# Patient Record
Sex: Female | Born: 1970 | Race: White | Hispanic: Yes | Marital: Married | Smoking: Never smoker
Health system: Southern US, Community
[De-identification: ages and names within clinical notes are randomized; demographics above are authoritative.]

---

## 1998-09-12 ENCOUNTER — Other Ambulatory Visit: Admission: RE | Admit: 1998-09-12 | Discharge: 1998-09-12 | Payer: Self-pay | Admitting: Gynecology

## 1999-11-27 ENCOUNTER — Other Ambulatory Visit: Admission: RE | Admit: 1999-11-27 | Discharge: 1999-11-27 | Payer: Self-pay | Admitting: Gynecology

## 2001-04-15 ENCOUNTER — Other Ambulatory Visit: Admission: RE | Admit: 2001-04-15 | Discharge: 2001-04-15 | Payer: Self-pay | Admitting: Gynecology

## 2002-06-06 ENCOUNTER — Other Ambulatory Visit: Admission: RE | Admit: 2002-06-06 | Discharge: 2002-06-06 | Payer: Self-pay | Admitting: Obstetrics and Gynecology

## 2003-02-14 ENCOUNTER — Other Ambulatory Visit: Admission: RE | Admit: 2003-02-14 | Discharge: 2003-02-14 | Payer: Self-pay | Admitting: Gynecology

## 2003-09-04 ENCOUNTER — Inpatient Hospital Stay (HOSPITAL_COMMUNITY): Admission: AD | Admit: 2003-09-04 | Discharge: 2003-09-07 | Payer: Self-pay | Admitting: Gynecology

## 2003-09-04 ENCOUNTER — Encounter (INDEPENDENT_AMBULATORY_CARE_PROVIDER_SITE_OTHER): Payer: Self-pay | Admitting: Specialist

## 2003-10-16 ENCOUNTER — Other Ambulatory Visit: Admission: RE | Admit: 2003-10-16 | Discharge: 2003-10-16 | Payer: Self-pay | Admitting: Gynecology

## 2005-10-19 ENCOUNTER — Emergency Department (HOSPITAL_COMMUNITY): Admission: EM | Admit: 2005-10-19 | Discharge: 2005-10-19 | Payer: Self-pay | Admitting: Emergency Medicine

## 2006-06-07 ENCOUNTER — Emergency Department (HOSPITAL_COMMUNITY): Admission: EM | Admit: 2006-06-07 | Discharge: 2006-06-08 | Payer: Self-pay | Admitting: Internal Medicine

## 2006-06-26 ENCOUNTER — Emergency Department (HOSPITAL_COMMUNITY): Admission: EM | Admit: 2006-06-26 | Discharge: 2006-06-26 | Payer: Self-pay | Admitting: Emergency Medicine

## 2006-07-04 ENCOUNTER — Encounter (INDEPENDENT_AMBULATORY_CARE_PROVIDER_SITE_OTHER): Payer: Self-pay | Admitting: Specialist

## 2006-07-04 ENCOUNTER — Inpatient Hospital Stay (HOSPITAL_COMMUNITY): Admission: EM | Admit: 2006-07-04 | Discharge: 2006-07-05 | Payer: Self-pay | Admitting: Emergency Medicine

## 2006-11-08 IMAGING — CR DG ABDOMEN ACUTE W/ 1V CHEST
3 series · 3 of 3 positions shown · non-contrast
Comparison: Chest x-ray 10/19/05 reviewed.

CLINICAL DATA: Abdominal pain, vomiting. 
 ACUTE ABDOMINAL SERIES:

[w chest pa]
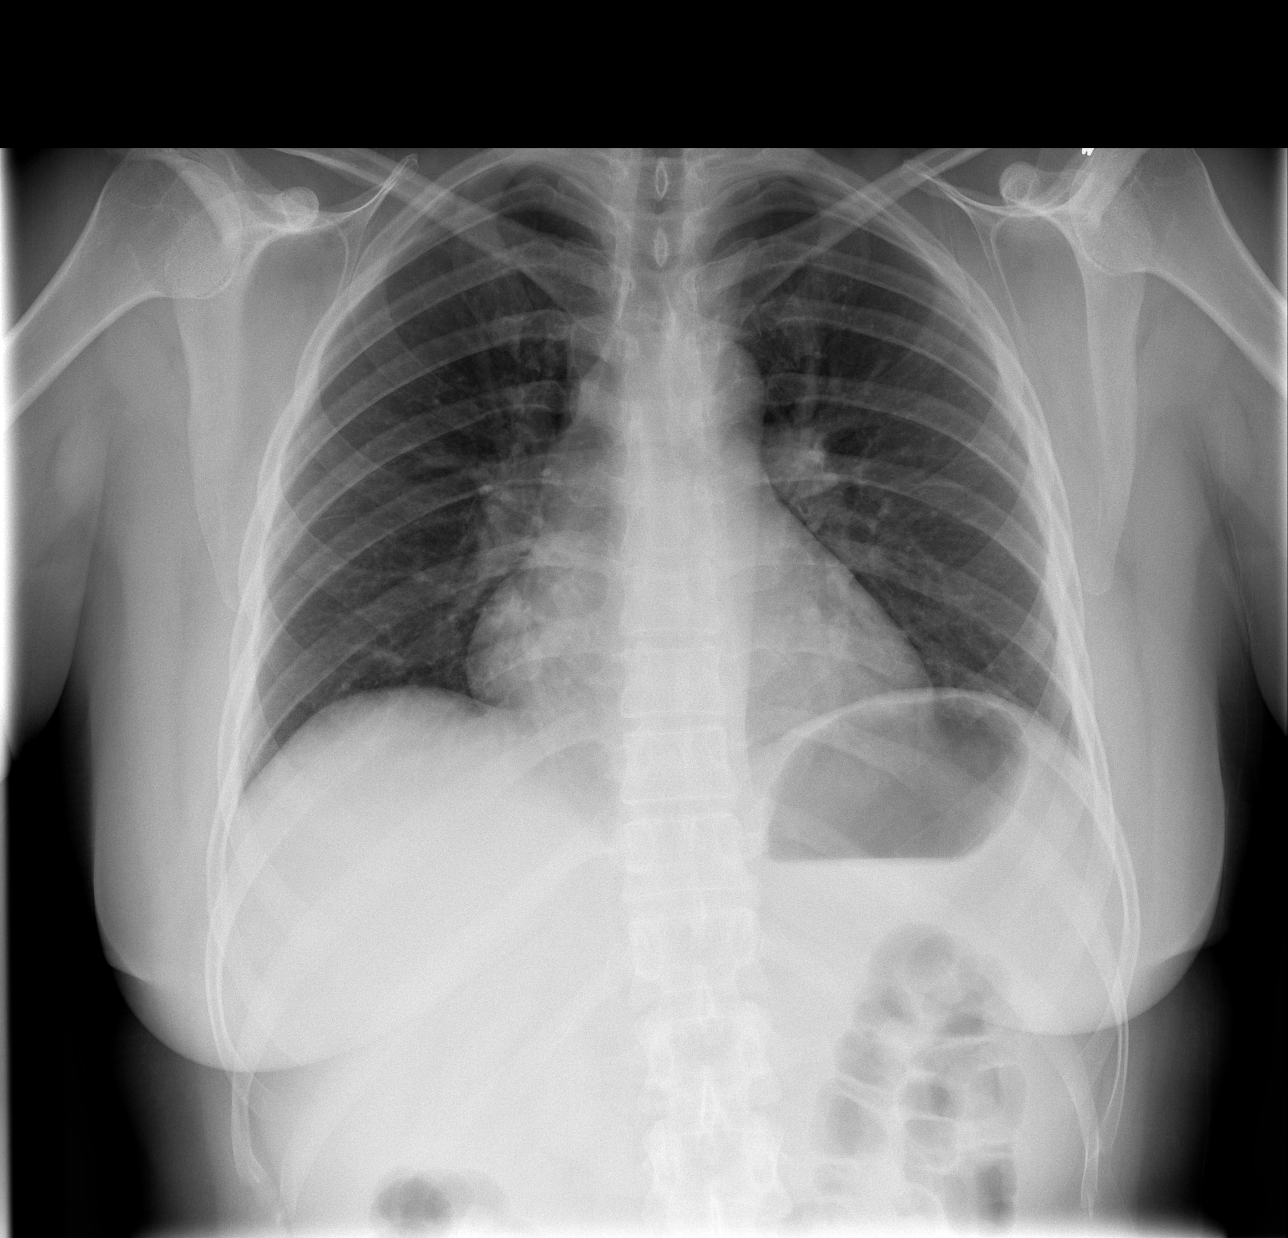

[w abdomen upright]
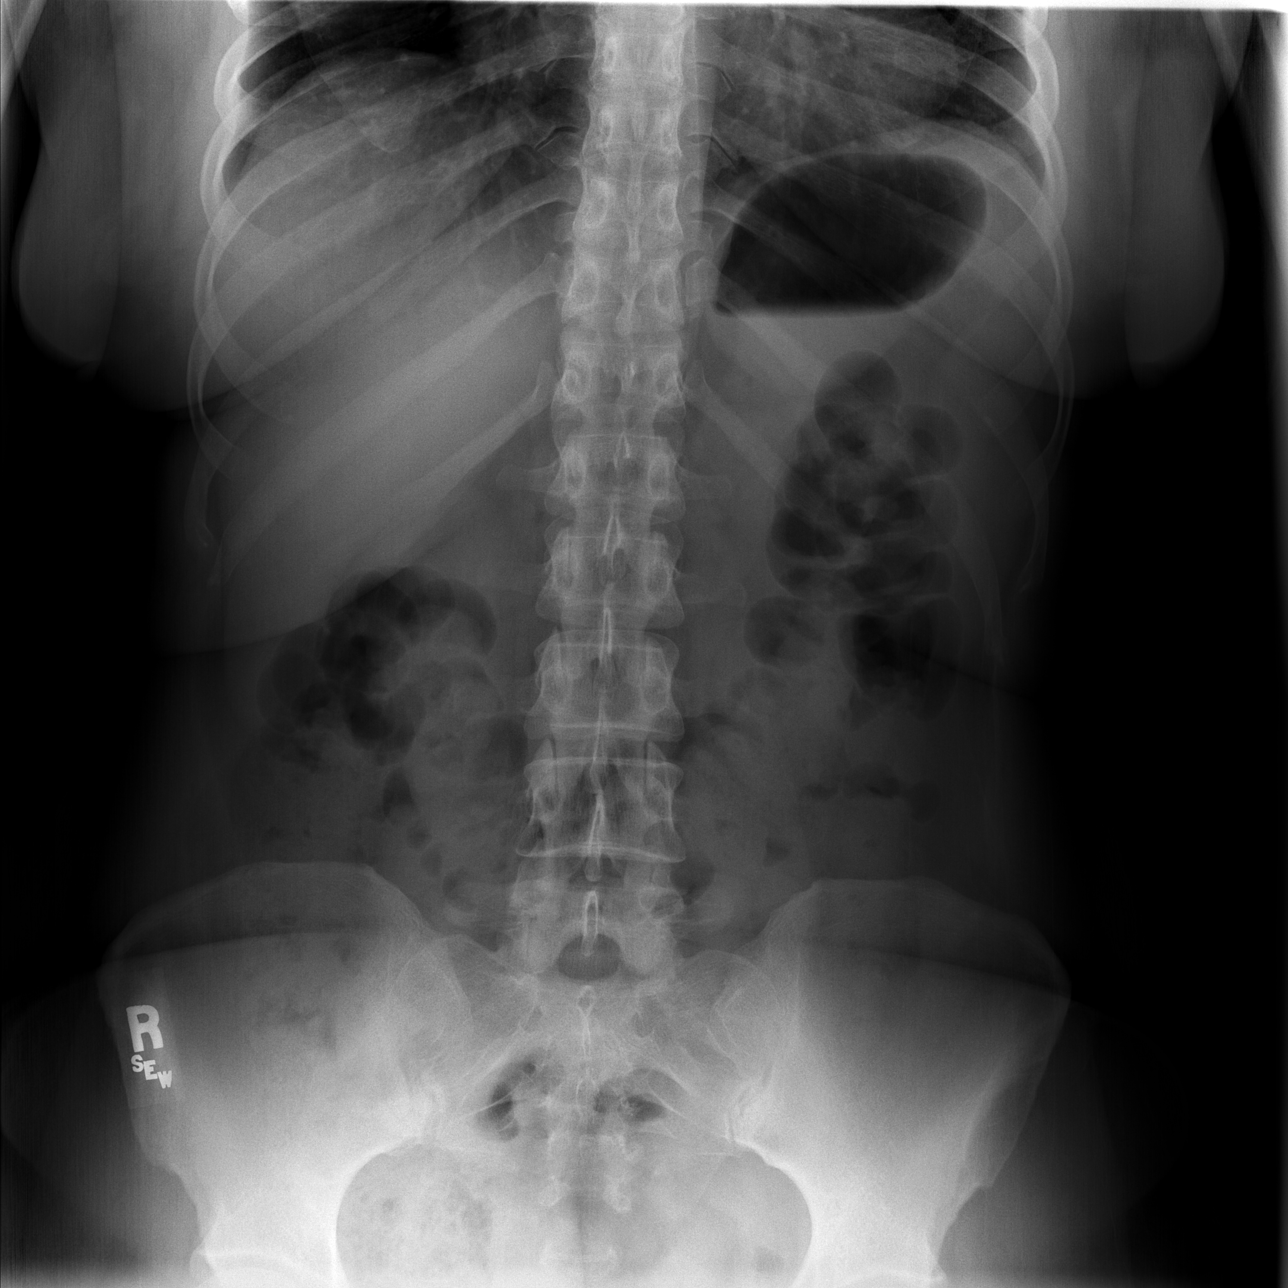

[t abdomen supine]
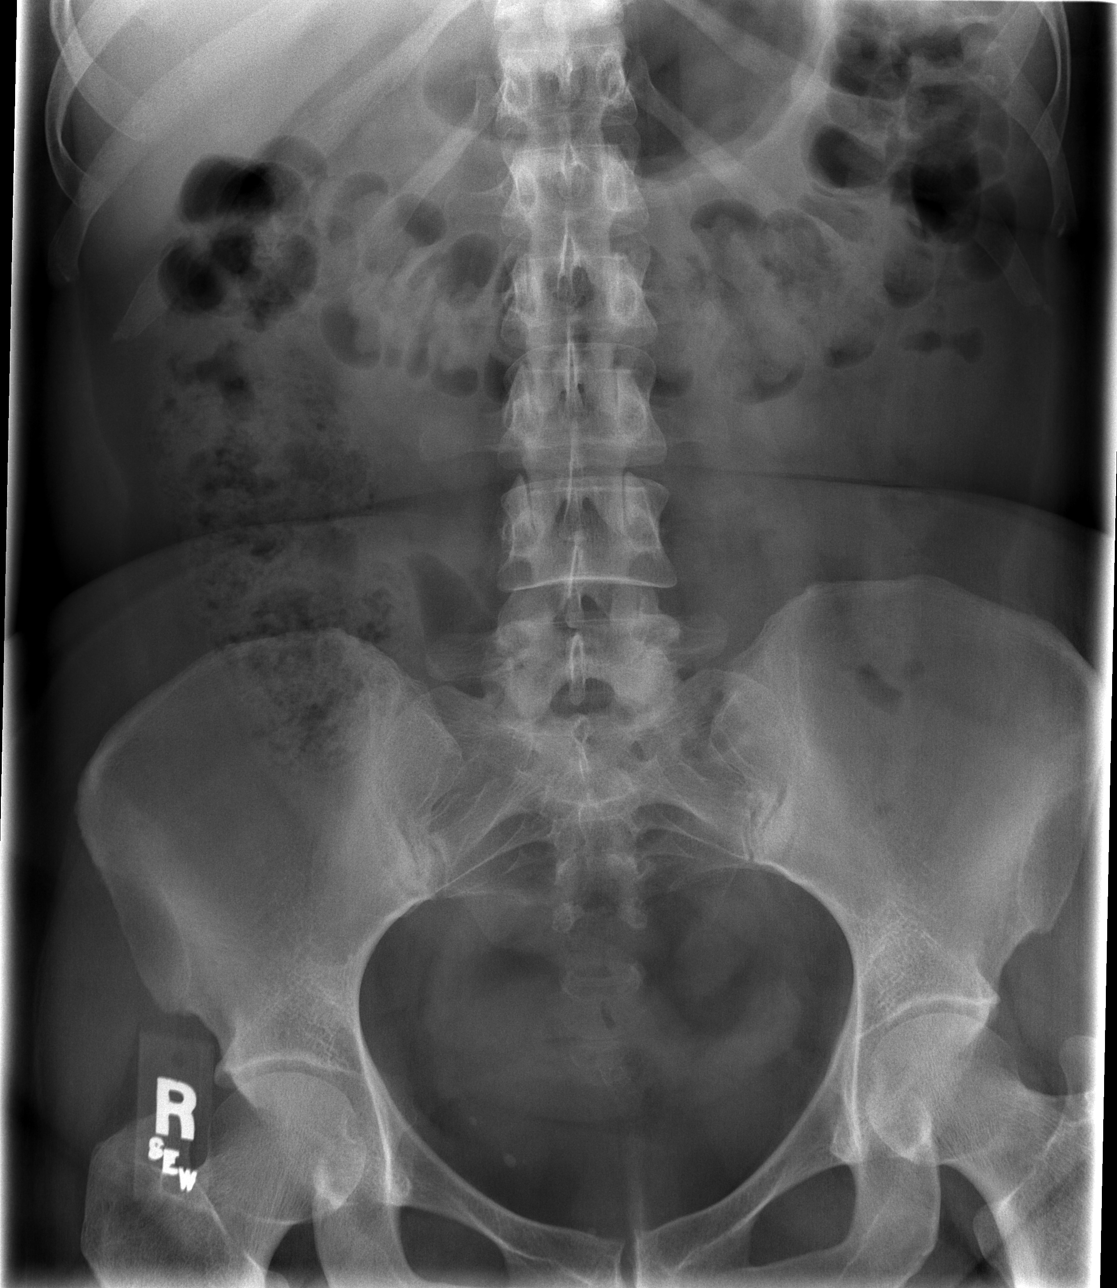

[3 of 3 positions shown; findings below may reference images not displayed]

FINDINGS: Single view chest:  Lungs are clear.  Heart size is normal.  No effusion. 
 Abdomen:  No free intraperitoneal air.  Bowel gas pattern is normal.  There is a moderate volume of stool present in the ascending and transverse colon.  No unexpected abdominal calcifications.
IMPRESSION: No acute finding with mild to moderate constipation noted.

## 2006-12-04 IMAGING — US US ABDOMEN COMPLETE
1 series · 14 of 25 positions shown · non-contrast
Comparison: none

CLINICAL DATA: Abdominal pain

[Series 1: unknown · 0.35mm/px · 14 of 60 slices shown]
[im 1/60]
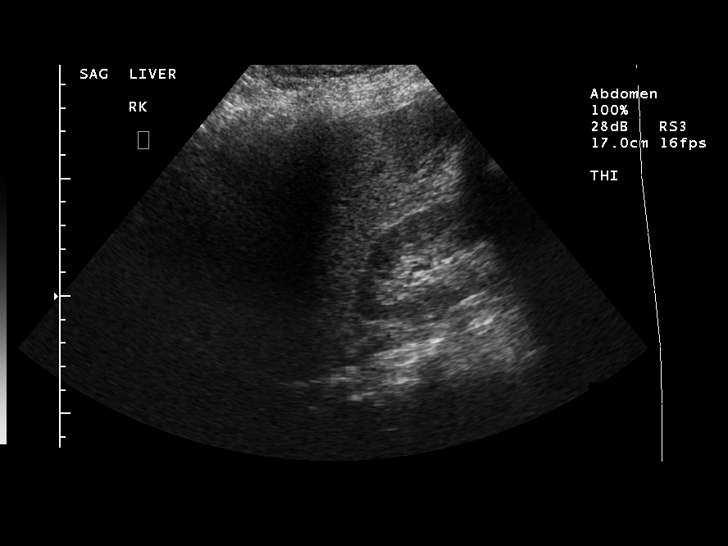
[im 5/60]
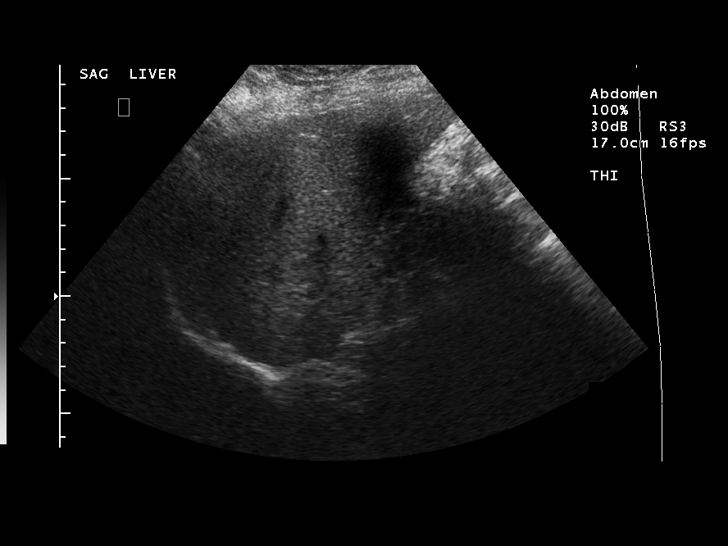
[im 10/60]
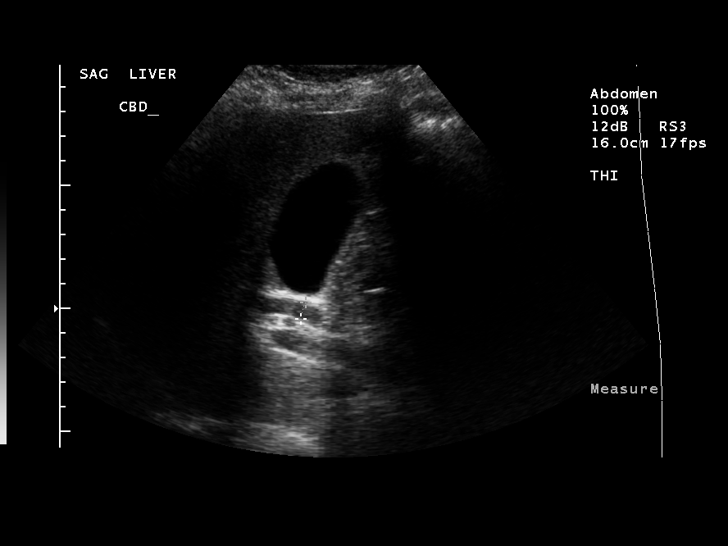
[im 15/60]
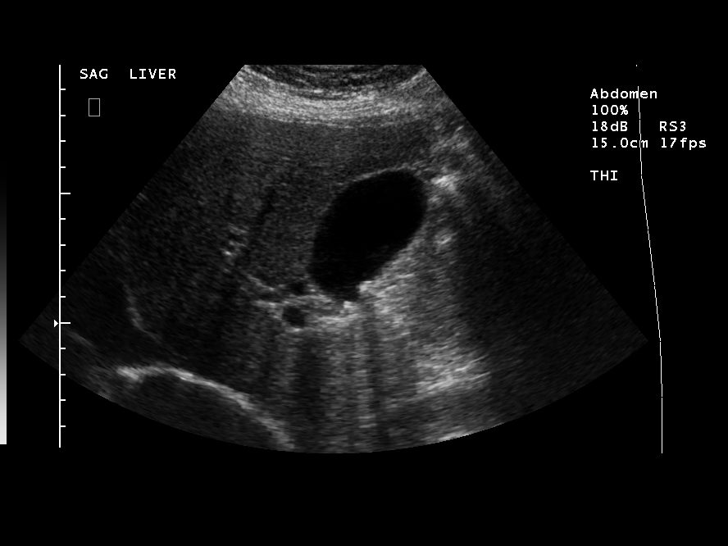
[im 20/60]
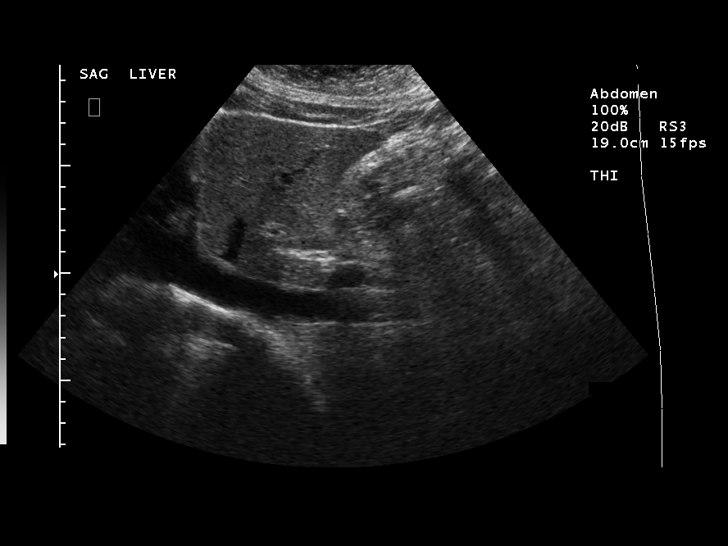
[im 23/60]
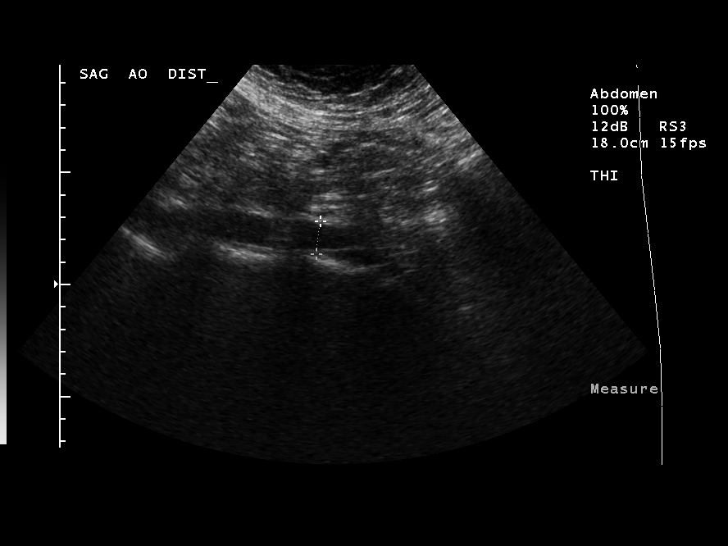
[im 28/60]
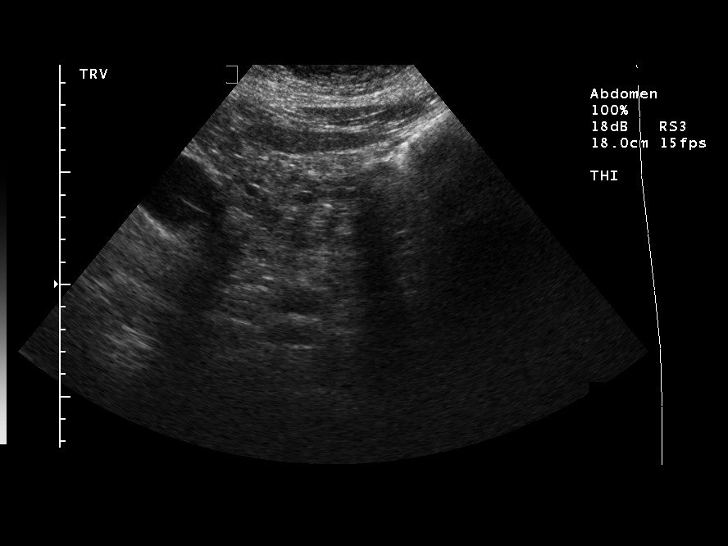
[im 32/60]
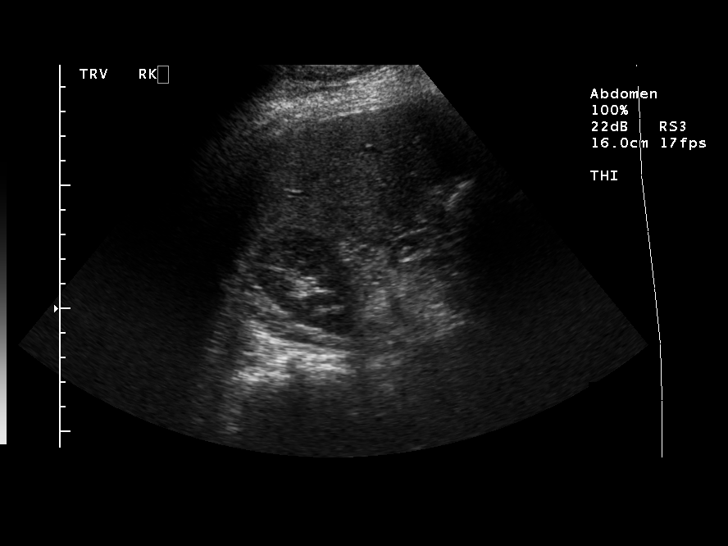
[im 37/60]
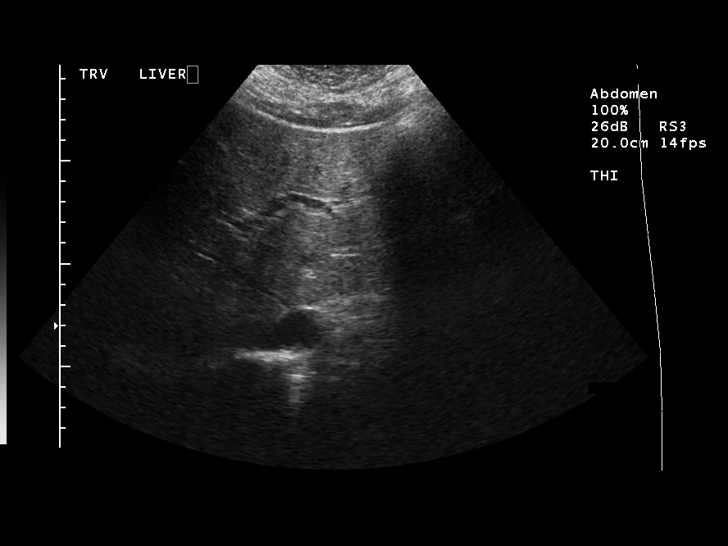
[im 40/60]
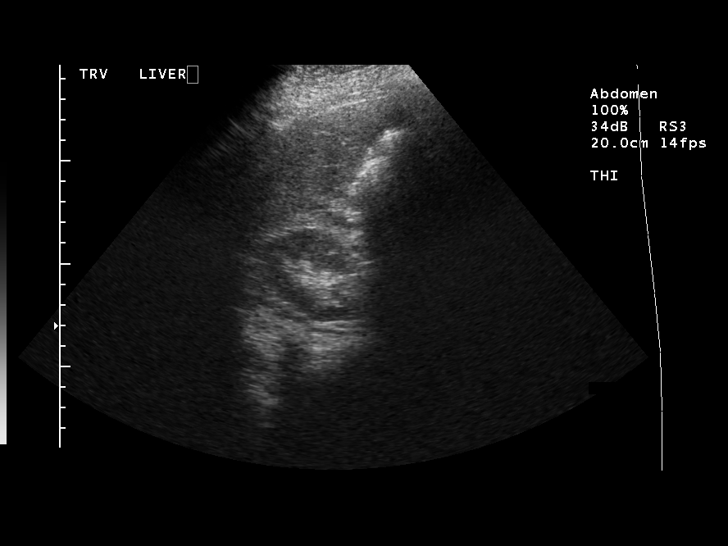
[im 45/60]
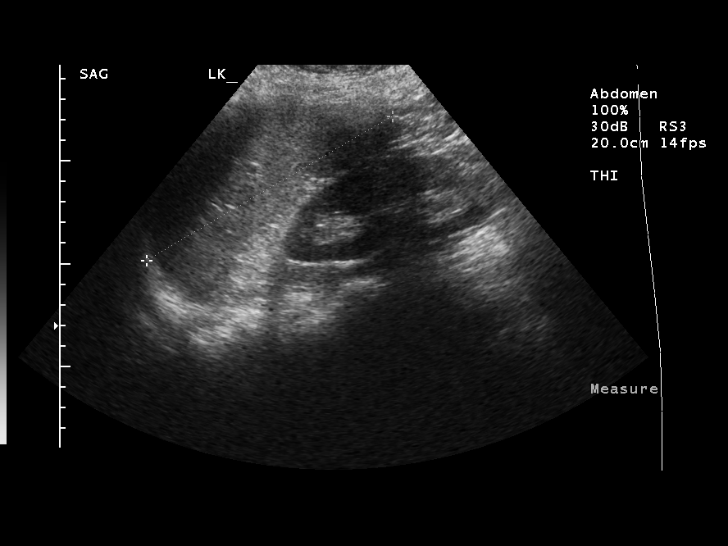
[im 50/60]
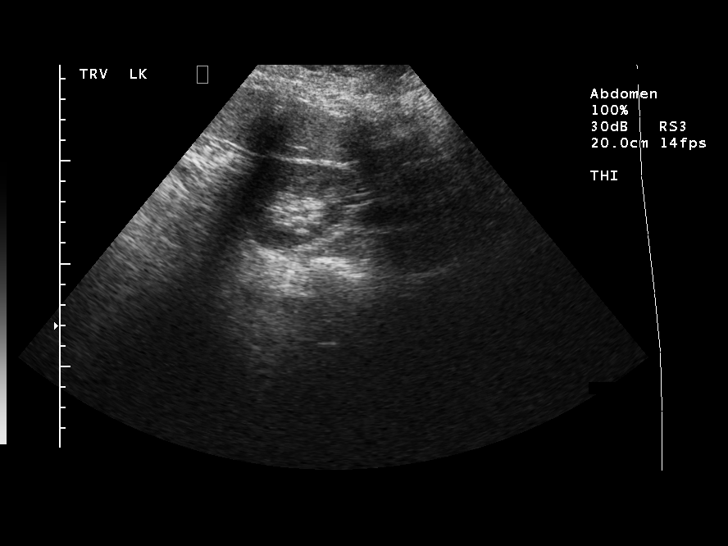
[im 55/60]
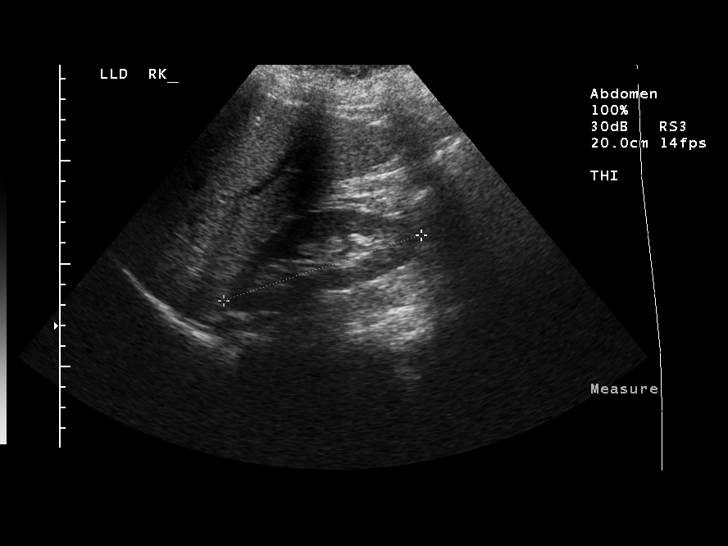
[im 60/60]
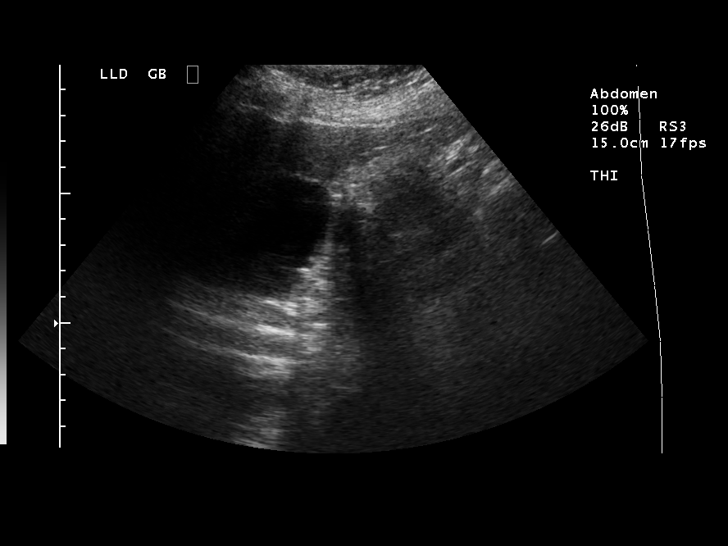

[14 of 25 positions shown; findings below may reference images not displayed]

Ultrasound abdomen:

Comparison 06/08/2006. Liver homogeneous in echotexture without focal lesion.
Gallbladder physiologically distended containing several subcentimeter stones in
its dependent portion and in the gallbladder neck. There is no gallbladder wall
thickening or pericholecystic fluid. Visualized portions of pancreas, abdominal
aorta, IVC unremarkable. Kidneys and spleen unremarkable. Common bile duct
mildly dilated, 7 mm diameter.
IMPRESSION: 1. Cholelithiasis and mild common bile duct dilatation without intrahepatic
biliary ductal dilatation evident.

## 2008-11-26 ENCOUNTER — Emergency Department (HOSPITAL_COMMUNITY): Admission: EM | Admit: 2008-11-26 | Discharge: 2008-11-26 | Payer: Self-pay | Admitting: Emergency Medicine

## 2010-12-04 LAB — URINALYSIS, ROUTINE W REFLEX MICROSCOPIC
Glucose, UA: NEGATIVE mg/dL
Protein, ur: NEGATIVE mg/dL
pH: 6.5 (ref 5.0–8.0)

## 2010-12-04 LAB — URINE MICROSCOPIC-ADD ON

## 2011-01-10 NOTE — Op Note (Signed)
Tabitha Turner, Tabitha Turner               ACCOUNT NO.:  [id number]   MEDICAL RECORD NO.:  [id number]          PATIENT TYPE:  INP   LOCATION:  5733                         FACILITY:  [facility]   PHYSICIAN:  Sharlet Salina T. Hoxworth, M.D.DATE OF BIRTH:  23-Nov-1970   DATE OF PROCEDURE:  07/04/2006  DATE OF DISCHARGE:                               OPERATIVE REPORT   PREOPERATIVE DIAGNOSES:  Cholelithiasis, cholecystitis.   POSTOPERATIVE DIAGNOSES:  Cholelithiasis, cholecystitis.   SURGICAL PROCEDURES:  Laparoscopic cholecystectomy with intraoperative  cholangiogram.   SURGEON:  Sharlet Salina T. Hoxworth, M.D.   ASSISTANT:  None.   ANESTHESIA:  General.   BRIEF HISTORY:  Tabitha Turner is a 40 year old female who presents with  acute epigastric right upper quadrant abdominal pain.  Gallbladder  ultrasound has shown some thickening of the gallbladder wall and  gallstones.  She has mildly elevated transaminases.  Laparoscopic  cholecystectomy with cholangiogram has been recommended for apparent  cholelithiasis and cholecystitis.  The nature of the procedure,  indications, risks of bleeding and infection were discussed and  understood preoperatively.  She is now brought to the operating room for  this procedure.   DESCRIPTION OF OPERATION:  The patient was brought to the operating room  and placed in supine position on the operating table, and general  orotracheal anesthesia was induced.  PAS were in place.  She received  preoperative IV antibiotics.  The abdomen was widely sterilely prepped  and draped.  Correct patient and procedure were verified.  Local  anesthesia was used to infiltrate the trocar sites prior to the  incisions.  A 1 cm incision was made at the umbilicus and dissection  carried down to the midline fascia, which was sharply incised for 1 cm,  and the peritoneum was entered under direct vision.  Through a mattress  suture of 0 Vicryl, the Hasson trocars was placed and pneumoperitoneum  established.  Under direct vision an 11 mm trocar was placed in the  subxiphoid area and two 5 mm trocars on the right subcostal margin.  The  gallbladder was visualized and appeared slightly edematous but not  acutely inflamed.  The fundus was grasped and elevated up over the liver  and the infundibulum retracted inferolaterally.  Peritoneum anterior  posterior to Calot's triangle was incised and fibrofatty tissue was  stripped off the neck of the gallbladder toward the porta hepatis and  Calot's triangle thoroughly dissected.  The cystic duct and the cystic  artery were identified and the cystic duct-gallbladder junction  dissected for 360 degrees.  When the anatomy was clear, the cystic duct  was clipped at the gallbladder junction.  Operative cholangiogram was  obtained through the cystic duct.  A couple of small stones were milked  out of the cystic duct before placing the catheter.  Operative  cholangiogram showed good filling of a normal common bile duct and  intrahepatic ducts with free flow into the duodenum and no filling  defects.  Following this the catheter was removed and the cystic duct  was doubly clipped proximally and divided.  The cystic artery, clearly  identified, was divided  between clips.  The gallbladder was dissected  free from its bed using hook cautery.  One vessel along the gallbladder  bed was clipped.  The gallbladder was removed intact and taken out  through the umbilicus.  The right upper quadrant was irrigated and  complete hemostasis was assured.  The trocars were removed under direct  vision and all CO2 evacuated.  The  mattress suture was secured to the umbilicus.  Skin incisions were  closed with interrupted subcuticular 5-0 Monocryl and Steri-Strips.  Sponge, needle and instrument counts were correct.  Dry sterile  dressings were applied and the patient taken recovery in good condition.      Lorne Skeens. Hoxworth, M.D.  Electronically  Signed     BTH/MEDQ  D:  07/04/2006  T:  07/04/2006  Job:  [id number]

## 2011-01-10 NOTE — Op Note (Signed)
Tabitha Turner, Tabitha Turner                           ACCOUNT NO.:  [id number]   MEDICAL RECORD NO.:  [id number]                   PATIENT TYPE:  INP   LOCATION:  9198                                 FACILITY:  [facility]   PHYSICIAN:  Juan H. Lily Peer, M.D.             DATE OF BIRTH:  01/23/1969   DATE OF PROCEDURE:  09/04/2003  DATE OF DISCHARGE:                                 OPERATIVE REPORT   INDICATION FOR OPERATION:  A 40 year old, gravida 3, para 2, at 41-1/2 weeks  estimated gestational age with suspected fetal microsomia.  The patient  decided to proceed with a primary cesarean section to decrease her chances  of shoulder dystocia and problems with child after delivery.  Also, they  have elected to proceed with a permanent sterilization.  Risks, benefits,  pros, cons, and failure rates of sterilization have been discussed.   PREOPERATIVE DIAGNOSES:  1. Term intrauterine pregnancy at 41-1/2 weeks estimated gestational age.  2. Suspected fetal microsomia.  3. Request for elective permanent sterilization.   POSTOPERATIVE DIAGNOSES:  1. Term intrauterine pregnancy at 41-1/2 weeks estimated gestational age.  2. Suspected fetal microsomia.  3. Request for elective permanent sterilization.   ANESTHESIA:  Spinal.   SURGEON:  Juan H. Lily Peer, M.D.   PROCEDURES PERFORMED:  1. Primary lower uterine segment transverse cesarean section.  2. Bilateral tubal sterilization procedure, Pomeroy technique.   FINDINGS:  Clear amniotic fluid.  Viable female infant with nuchal cord x  1  around the neck.  Apgars of 9 and 9 with a weight of 10 pounds 3 ounces.  Arterial cord pH is 7.31 and normal maternal pelvic anatomy.   DESCRIPTION OF OPERATION:  After the patient was adequately counseled, she  was taken to the operating room where she underwent a successful spinal  placement.  The abdomen and vagina was prepped and draped in the usual  sterile fashion.  A Foley catheter was inserted in effort to  monitor urinary  output.  After the drapes were in place, a Pfannenstiel skin incision was  made 2 cm above the symphysis pubis.  The incision was carried down through  the skin, subcutaneous tissue, down to the rectus fascia, whereby a midline  nick was made.  The fascia was incised in transverse fashion.  The  peritoneal cavity was entered cautiously.  The bladder flap was established.  A transverse uterine incision was made.  Clear amniotic fluid was present.  The newborn's head was delivered and requiring to manually reduce the nuchal  cord around the neck.  The nasopharyngeal air were bulb suctioned.  The  remainder of the baby was delivered, giving median cry.  The cord was doubly  clamped, excised, shown to the parents, and then passed off to the  neonatologist who was in attendance who gave the above-mentioned parameters.  After cord blood was obtained, the placenta was delivered from the  intrauterine cavity and  submitted for histological evaluation.  The uterus  was then exteriorized.  The intrauterine cavity was swept clear of remaining  products of conception, and the transverse incision was closed with a  running lock stitch of 0 Vicryl suture.  After this was completed, attention  was placed then for the proximal portion of the left fallopian tube in the  one-third portion as identified.  A Babcock clamp was used for tension.  A 2  cm segment was secured x 2 with 2-0 Vicryl suture, and this 2 cm segment was  excised and passed off of the field for the histological evaluation.  The  remaining stumps were Bovie cauterized.  Similar procedure was carried out  on the contralateral side.  The uterus was then placed back into the  abdominopelvic cavity.  The pelvic cavity was then copiously irrigated with  normal saline solution.  After ascertaining adequate hemostasis, the  visceral peritoneum was closed with a running stitch of 3-0 Vicryl suture.  The rectus fascia was closed with  a running stitch of 0 Vicryl suture.  The  subcutaneous bleeders were Bovie cauterized, and the skin was reapproximated  with skin clips followed by placing Xeroform gauze and 4 x 4 dressing.  The  patient was transferred to recovery room with stable vital signs.  Blood  loss for procedure was 700 mL.  IV fluid was 3550 mL.  Urine output was 250  mL and clear.                                               Juan H. Lily Peer, M.D.    JHF/MEDQ  D:  09/04/2003  T:  09/04/2003  Job:  [id number]

## 2011-01-10 NOTE — H&P (Signed)
Tabitha, Turner               ACCOUNT NO.:  [id number]   MEDICAL RECORD NO.:  [id number]          PATIENT TYPE:  INP   LOCATION:  5733                         FACILITY:  [facility]   PHYSICIAN:  Maisie Fus A. Cornett, M.D.DATE OF BIRTH:  02-22-1971   DATE OF ADMISSION:  07/03/2006  DATE OF DISCHARGE:                                HISTORY & PHYSICAL   CHIEF COMPLAINT:  Right upper quadrant pain.   HISTORY OF PRESENT ILLNESS:  The patient is a 40 year old Hispanic female  who has been seen on multiple occasions in the emergency room for right  upper quadrant pain.  She was evaluated in the past and this evening and  found to have cholelithiasis.  I was able to get better history from her  niece who speaks English since the patient speaks no [place].  She has had  multiple episodes of severe right upper quadrant pain 10/10 requiring trips  to the emergency room for evaluation and pain medicine.  She has also been  seen as an outpatient at [facility], but I do not have any notes to review  presently.  The pain is severe in intensity located in the right upper  quadrant and associated with nausea and vomiting.  There is no radiation.   PAST MEDICAL HISTORY:  None except for gallstones.   FAMILY HISTORY:  Noncontributory.   SOCIAL HISTORY:  Denies any tobacco or alcohol use.  She is Hispanic.   ALLERGIES:  None.   MEDICATIONS:  Ibuprofen and Phenergan.   REVIEW OF SYSTEMS:  Otherwise negative except as noted above.   PHYSICAL EXAMINATION:  Temperature 97, pulse 95, blood pressure 131/91.  GENERAL APPEARANCE:  Hispanic female resting in no obvious distress.  HEENT:  Extraocular muscles are intact.  Pupils are equal, round, and  reactive to light.  Oropharynx is clear.  NECK:  Supple, nontender, no mass.  CHEST:  Clear to auscultation, chest wall percussion normal.  CARDIOVASCULAR:  Regular rate and rhythm without murmur, rub, or gallop.  Peripheral pulse are 2+ in upper and lower  extremities.  ABDOMEN:  Tender right upper quadrant.  Gallbladder is palpable on  examination at the right costal margin.  No mass, no hernia.  EXTREMITIES:  Pulses intact.  Normal range of motion.  SKIN:  Normal skin without rash.  NEUROLOGIC:  Grossly intact to motor and sensory function and cranial nerve  function.   DIAGNOSTIC STUDIES:  Ultrasound reveals gallstones without any significant  gallbladder wall thickening.  There is minimal dilation of the common duct.  She has a white count of 13,000 with a shift, hemoglobin 13.  LFT's show a  bilirubin total of 1.1, alk phos normal, AST and ALT elevated at 238 and  239, respectively.  Sodium 138, potassium 3.9, chloride 192, CO2 27, BUN 13,  creatinine 0.7, glucose 14.   IMPRESSION:  Acute and chronic cholelithiasis.   PLAN:  Admit for pain control and antibiotics will be started.  We talked  about cholecystectomy during the admission.      Thomas A. Cornett, M.D.  Electronically Signed     TAC/MEDQ  D:  07/04/2006  T:  07/04/2006  Job:  [id number]

## 2011-01-10 NOTE — Discharge Summary (Signed)
Tabitha Turner, Tabitha Turner                           ACCOUNT NO.:  [id number]   MEDICAL RECORD NO.:  [id number]                   PATIENT TYPE:  INP   LOCATION:  9112                                 FACILITY:  [facility]   PHYSICIAN:  Juan H. Lily Peer, M.D.             DATE OF BIRTH:  01/23/1969   DATE OF ADMISSION:  09/04/2003  DATE OF DISCHARGE:  09/07/2003                                 DISCHARGE SUMMARY   DISCHARGE DIAGNOSES:  1. Intrauterine pregnancy 41+ weeks, delivered.  2. Fetal macrosomia.  3. Request for elective permanent sterilization.  4. History of group B streptococcus.  5. Status post primary lower uterine segment transverse cesarean section.  6. Bilateral tubal sterilization procedure, Pomeroy technique by Dr. Reynaldo Minium on September 04, 2003.   HISTORY:  This is a 40 year old female, gravida 3, para 2, whose prenatal  course has been uncomplicated by abnormal AFP and underwent genetic  amniocentesis that revealed normal chromosomes.  Also with a history of  positive group B strep.  She was seen in the office on August 31, 2003 where  an ultrasound was ordered due to size greater than dates.  This demonstrated  the fetus was in greater than the 92 percentile for 41 weeks with an  estimated fetal weight of 4474 kg, plus or minus 671 and vertex presentation  with cervix long and closed.  The patient was counseled and desired cesarean  section.  The patient also desired permanent sterilization.   HOSPITAL COURSE:  On September 04, 2003 the patient was admitted at 41+ weeks.  She underwent a primary low uterine segment transverse cesarean section and  bilateral tubal sterilization procedure, Pomeroy technique by Dr. Reynaldo Minium.  She underwent delivery of a female, Apgars of 9 and 9.  Weight was  10 pounds, 3 ounces.  There were no complications.  Postoperatively the  patient remained afebrile, remained in stable condition.  She was thus  discharged to home on September 07, 2003 __________.   On September 05, 2003 hemoglobin was 9.1.   DISPOSITION:  The patient is discharged home to return in 6 weeks.   DISCHARGE MEDICATIONS:  Tylox p.r.n. pain, prescription given.     Susa Loffler, P.A.                    Juan H. Lily Peer, M.D.    TSG/MEDQ  D:  10/09/2003  T:  10/09/2003  Job:  [id number]

## 2011-01-10 NOTE — H&P (Signed)
NAME:  Tabitha Turner, Tabitha Turner NO.:  [id number]   MEDICAL RECORD NO.:  [id number]                   PATIENT TYPE:   LOCATION:                                       FACILITY:   PHYSICIAN:  Juan H. Lily Peer, M.D.             DATE OF BIRTH:   DATE OF ADMISSION:  09/04/2003  DATE OF DISCHARGE:                                HISTORY & PHYSICAL   CHIEF COMPLAINT:  Suspected fetal macrosomia at 41-1/2 week's estimated  gestational age.   HISTORY OF PRESENT ILLNESS:  The patient is a 40 year old gravida 3, para 2,  who will be 41-1/2 week's estimated gestational age at the time of her  planned cesarean section.  The patient had been seen in the office on  August 31, 2003, where an ultrasound had been ordered due to size greater  than dates.  The ultrasound had demonstrated that the fetus was in the  greater than 95th percentile for 41 weeks' gestation with estimated fetal  weight of 4474 g plus/minus 671 g (9 pounds 13 ounces).  AFI was 12.8 which  is the 55th percentile.  Fetus is in the vertex presentation.  Heart rate  was 130 beats per minute.  The pelvic examination demonstrated the head was  not engaged.  The cervix was long, closed and posterior.  Based on these  findings, the recommendation was to proceed with a cesarean section and the  patient had agreed in an effort to prevent any potential problems such as  with dystocia.  The patient's prenatal course has been significant for the  fact that clinical history that she denies any allergies.  She underwent a  genetic amniocentesis due to the fact that her maternal serum alpha protein  had shown an increased risks of Down's syndrome of 1 in 205.  She  subsequently underwent a genetic amniocentesis with a report of a 20 XY  normal female.  She also had a history of positive group B strep culture in  her previous pregnancy and during her pregnancy she had requested and  discussed on numerous occasions her  request for immediate postpartum tubal  sterilization.  Otherwise, the rest of her prenatal course had been  uneventful.   ALLERGIES:  She denies any allergies.   PAST OBSTETRICAL HISTORY:  She has had two normal spontaneous vaginal  deliveries in 40 1987 and 1997, respectively, with average weights of 8 pounds.  She had a group B streptococcus culture positive in each pregnancy.  She has  had a history of rubella immunization in the past.   REVIEW OF SYSTEMS:  See [place] form.   PHYSICAL EXAMINATION:  VITAL SIGNS:  Blood pressure 110/66, weight 215  pounds.  Urine was trace for protein and negative for glucose.  HEENT:  Unremarkable.  NECK:  Supple.  Trachea midline.  No carotid bruits, no thyromegaly.  LUNGS:  Clear to auscultation without rhonchi or wheezes.  HEART:  Regular rate and rhythm, no murmurs or gallops.  BREASTS:  Exam not done.  ABDOMEN:  Gravid uterus.  Fundal height 43 cm.  Vertex presentation by  [facility] maneuver.  Positive fetal heart tones.  PELVIC:  Cervix long, closed and posterior.  Ballottable.  Vertex  presentation.  EXTREMITIES:  Trace edema.  Deep tendon reflexes 1+, negative clonus.   PRENATAL LABORATORY DATA:  O positive blood type, negative antibody screen.  VDRL was nonreactive.  Hepatitis B surface antigen and HIV were nonreactive/  Rubella was immune.  Alpha fetoprotein was abnormal.  Followed up with a  normal genetic amniocentesis as described above.  The patient's blood sugar  screening was normal.  GBS culture this pregnancy.   ASSESSMENT:  [age]-year-old gravida 3, para 2 at 41-1/2 week's  estimated gestational age with suspected fetal macrosomia and non engaged  head in a long cervix.  The patient would best be served by a primary  cesarean section and concurrently at this time fulfill her wishes with a  tubal sterilization procedure.  The C-section is recommended due to the fact  that there is increased risk for shoulder dystocia and  problems at delivery.  All of this discussed with the patient in Spanish.  She is also aware that  at the time of her C-section and tying her fallopian tubes will limit her to  reproductive capacity and in the future she will not be able to have any  more children.  She has made a conscientious decision and would like to  proceed with such.  The risks, benefits, pros and cons of both procedures  were discussed as well as failure rate from sterilization.  All questions  were answered and we will follow up accordingly.   PLAN:  The patient is scheduled for primary cesarean section at 3 p.m.  tomorrow, September 04, 2003, at [facility].                                               Laf[place] H. Lily Peer, M.D.    JHF/MEDQ  D:  09/03/2003  T:  09/03/2003  Job:  [id number]

## 2016-09-24 ENCOUNTER — Ambulatory Visit (INDEPENDENT_AMBULATORY_CARE_PROVIDER_SITE_OTHER): Payer: Self-pay | Admitting: Emergency Medicine

## 2016-09-24 VITALS — BP 122/72 | HR 122 | Temp 98.9°F | Resp 17 | Ht 60.5 in | Wt 173.0 lb

## 2016-09-24 DIAGNOSIS — R070 Pain in throat: Secondary | ICD-10-CM

## 2016-09-24 DIAGNOSIS — J029 Acute pharyngitis, unspecified: Secondary | ICD-10-CM | POA: Insufficient documentation

## 2016-09-24 MED ORDER — PREDNISONE 20 MG PO TABS: 40.0000 mg | ORAL_TABLET | Freq: Every day | ORAL | 0 refills | Status: AC

## 2016-09-24 MED ORDER — AZITHROMYCIN 250 MG PO TABS: ORAL_TABLET | ORAL | 0 refills | Status: AC

## 2016-09-24 NOTE — Patient Instructions (Addendum)
     IF you received an x-ray today, you will receive an invoice from [facility]. Please contact [facility] at [phone] with questions or concerns regarding your invoice.   IF you received labwork today, you will receive an invoice from [facility]. Please contact [facility] at [phone] with questions or concerns regarding your invoice.   Our billing staff will not be able to assist you with questions regarding bills from these companies.  You will be contacted with the lab results as soon as they are available. The fastest way to get your results is to activate your My Chart account. Instructions are located on the last page of this paperwork. If you have not heard from us regarding the results in 2 weeks, please contact this office.     Faringitis (Pharyngitis) La faringitis es el dolor de garganta (faringe). La garganta presenta enrojecimiento, hinchazn y dolor. CUIDADOS EN EL HOGAR  Beba suficiente lquido para mantener la orina clara o de color amarillo plido.  Solo tome los medicamentos que le haya indicado su mdico. ? Si no toma los medicamentos segn las indicaciones podra volver a enfermarse. Finalice la prescripcin completa, aunque comience a sentirse mejor. ? No tome aspirina.  Reposo.  Enjuguese la boca (hacer grgaras) con agua y sal (cucharadita de sal por litro de agua) cada 1 o 2horas. Esto ayudar a aliviar el dolor.  Si no corre riesgo de ahogarse, puede chupar un caramelo duro o pastillas para la garganta.  SOLICITE AYUDA SI:  Tiene bultos grandes y dolorosos al tacto en el cuello.  Tiene una erupcin cutnea.  Cuando tose elimina una expectoracin verde, amarillo amarronado o con sangre.  SOLICITE AYUDA DE INMEDIATO SI:  Presenta rigidez en el cuello.  Babea o no puede tragar lquidos.  Vomita o no puede retener los medicamentos ni los lquidos.  Siente un dolor intenso que no se alivia con medicamentos.  Tiene  problemas para respirar (y no debido a la nariz tapada).  ASEGRESE DE QUE:  Comprende estas instrucciones.  Controlar su afeccin.  Recibir ayuda de inmediato si no mejora o si empeora.  Esta informacin no tiene como fin reemplazar el consejo del mdico. Asegrese de hacerle al mdico cualquier pregunta que tenga. Document Released: 11/07/2008 Document Revised: 06/01/2013 Document Reviewed: 04/18/2013 Elsevier Interactive Patient Education  2017 [employer]  

## 2016-09-24 NOTE — Progress Notes (Signed)
Tabitha CosierSandra Turner 46 y.o.   Chief Complaint  Patient presents with  . Sore Throat    HISTORY OF PRESENT ILLNESS: This is a 46 y.o. female complaining of sore throat x 3 days.  Sore Throat   This is a new problem. The current episode started in the past 7 days. The problem has been gradually worsening. The maximum temperature recorded prior to her arrival was 101 - 101.9 F. The fever has been present for less than 1 day. The pain is at a severity of 5/10. The pain is moderate. Associated symptoms include congestion, coughing, headaches, a hoarse voice, neck pain, swollen glands and trouble swallowing. Pertinent negatives include no abdominal pain, diarrhea, drooling, ear pain, shortness of breath, stridor or vomiting. She has had no exposure to strep or mono. She has tried cool liquids and acetaminophen for the symptoms. The treatment provided no relief.     Prior to Admission medications   Medication Sig Start Date End Date Taking? Authorizing Provider  [id number] (THERAFLU FLU/COLD/COUGH PO) Take by mouth.   Yes Historical Provider, MD    Not on File  There are no active problems to display for this patient.   No past medical history on file.  No past surgical history on file.  Social History   Social History  . Marital status: Single    Spouse name: N/A  . Number of children: N/A  . Years of education: N/A   Occupational History  . Not on file.   Social History Main Topics  . Smoking status: Never Smoker  . Smokeless tobacco: Never Used  . Alcohol use No  . Drug use: No  . Sexual activity: No   Other Topics Concern  . Not on file   Social History Narrative  . No narrative on file    No family history on file.   Review of Systems  Constitutional: Positive for fever. Negative for chills and malaise/fatigue.  HENT: Positive for congestion, hoarse voice, sore throat and trouble swallowing. Negative for drooling, ear pain, nosebleeds and sinus pain.     Eyes: Negative for discharge and redness.  Respiratory: Positive for cough. Negative for hemoptysis, shortness of breath, wheezing and stridor.   Cardiovascular: Negative for chest pain and palpitations.  Gastrointestinal: Negative for abdominal pain, diarrhea, nausea and vomiting.  Genitourinary: Negative for dysuria and hematuria.  Musculoskeletal: Positive for neck pain.  Skin: Negative for rash.  Neurological: Positive for headaches. Negative for weakness.  Endo/Heme/Allergies: Negative.   Psychiatric/Behavioral: Negative.   All other systems reviewed and are negative.  Vitals:   09/24/16 1636  BP: 122/72  Pulse: (!) 122  Resp: 17  Temp: 98.9 F (37.2 C)     Physical Exam  Constitutional: She is oriented to person, place, and time. She appears well-developed and well-nourished.  HENT:  Head: Normocephalic and atraumatic.  Right Ear: External ear normal.  Left Ear: External ear normal.  Nose: Nose normal.  Mouth/Throat: Posterior oropharyngeal edema and posterior oropharyngeal erythema present. No oropharyngeal exudate.  Eyes: Conjunctivae and EOM are normal. Pupils are equal, round, and reactive to light.  Neck: Normal range of motion. Neck supple. No JVD present. No thyromegaly present.  Cardiovascular: Regular rhythm and normal heart sounds.   HR: 100  Pulmonary/Chest: Effort normal and breath sounds normal.  Abdominal: Soft. Bowel sounds are normal. She exhibits no distension. There is no tenderness.  Musculoskeletal: Normal range of motion.  Lymphadenopathy:    She has cervical adenopathy.  Neurological: She  is alert and oriented to person, place, and time. No sensory deficit. She exhibits normal muscle tone.  Skin: Skin is warm and dry. Capillary refill takes less than 2 seconds.  Psychiatric: She has a normal mood and affect. Her behavior is normal.  Vitals reviewed.    ASSESSMENT & PLAN: Tabitha Turner was seen today for sore throat.  Diagnoses and all orders for  this visit:  Acute pharyngitis, unspecified etiology  Pain in throat  Other orders -     predniSONE (DELTASONE) 20 MG tablet; Take 2 tablets (40 mg total) by mouth daily with breakfast. -     azithromycin (ZITHROMAX) 250 MG tablet; Sig as indicated    Patient Instructions       IF you received an x-ray today, you will receive an invoice from [facility]. Please contact [facility] at [phone] with questions or concerns regarding your invoice.   IF you received labwork today, you will receive an invoice from [facility]. Please contact [facility] at [phone] with questions or concerns regarding your invoice.   Our billing staff will not be able to assist you with questions regarding bills from these companies.  You will be contacted with the lab results as soon as they are available. The fastest way to get your results is to activate your My Chart account. Instructions are located on the last page of this paperwork. If you have not heard from [place] regarding the results in 2 weeks, please contact this office.      Faringitis (Pharyngitis) La faringitis es el dolor de garganta (faringe). La garganta presenta enrojecimiento, hinchazn y dolor. CUIDADOS EN EL HOGAR  Beba suficiente lquido para mantener la orina clara o de color amarillo plido.  Solo tome los medicamentos que le haya indicado su mdico.  Si no toma los medicamentos segn las indicaciones podra volver a enfermarse. Finalice la prescripcin completa, aunque comience a sentirse mejor.  No tome aspirina.  Reposo.  Enjuguese la boca Arts administrator) con agua y sal (cucharadita de sal por litro de agua) cada 1 o 2horas. Esto ayudar a Engineer, materials.  Si no corre riesgo de ahogarse, puede chupar un caramelo duro o pastillas para la garganta. SOLICITE AYUDA SI:  Tiene bultos grandes y dolorosos al tacto en el cuello.  Tiene una erupcin cutnea.  Cuando tose elimina una  expectoracin verde, amarillo amarronado o con [place]. SOLICITE AYUDA DE INMEDIATO SI:  Presenta rigidez en el cuello.  Babea o no puede tragar lquidos.  Vomita o no puede retener los [employer] lquidos.  Siente un dolor intenso que no se alivia con medicamentos.  Tiene problemas para Industrial/product designer (y no debido a la nariz tapada). ASEGRESE DE QUE:  Comprende estas instrucciones.  Controlar su afeccin.  Recibir ayuda de inmediato si no mejora o si empeora. Esta informacin no tiene Theme park manager el consejo del mdico. Asegrese de hacerle al mdico cualquier pregunta que tenga. Document Released: 11/07/2008 Document Revised: 06/01/2013 Document Reviewed: 04/18/2013 Elsevier Interactive Patient Education  2017 [employer]      Edwina Barth, MD [facility]
